# Patient Record
Sex: Male | Born: 1990 | Race: White | Hispanic: No | Marital: Single | Smoking: Current every day smoker
Health system: Southern US, Community
[De-identification: ages and names within clinical notes are randomized; demographics above are authoritative.]

## PROBLEM LIST (undated history)

## (undated) ENCOUNTER — Emergency Department (HOSPITAL_COMMUNITY): Admission: EM | Discharge status: Absent without leave | Payer: Self-pay

## (undated) HISTORY — PX: ABSCESS DRAINAGE: SHX1119

## (undated) HISTORY — PX: MANDIBLE SURGERY: SHX707

---

## 2015-05-14 ENCOUNTER — Encounter (HOSPITAL_COMMUNITY): Payer: Self-pay | Admitting: Emergency Medicine

## 2015-05-14 ENCOUNTER — Emergency Department (HOSPITAL_COMMUNITY)
Admission: EM | Admit: 2015-05-14 | Discharge: 2015-05-14 | Disposition: A | Discharge status: Home / Self Care | Payer: Self-pay | Attending: Emergency Medicine | Admitting: Emergency Medicine

## 2015-05-14 DIAGNOSIS — K047 Periapical abscess without sinus: Secondary | ICD-10-CM | POA: Insufficient documentation

## 2015-05-14 DIAGNOSIS — F17210 Nicotine dependence, cigarettes, uncomplicated: Secondary | ICD-10-CM | POA: Insufficient documentation

## 2015-05-14 MED ORDER — IBUPROFEN 800 MG PO TABS: 800.0000 mg | ORAL_TABLET | Freq: Three times a day (TID) | ORAL | Status: DC

## 2015-05-14 MED ORDER — PROMETHAZINE HCL 12.5 MG PO TABS
12.5000 mg | ORAL_TABLET | Freq: Once | ORAL | Status: AC
  Administered 2015-05-14: 12.5 mg via ORAL
  Filled 2015-05-14: qty 1

## 2015-05-14 MED ORDER — IBUPROFEN 800 MG PO TABS
800.0000 mg | ORAL_TABLET | Freq: Once | ORAL | Status: AC
  Administered 2015-05-14: 800 mg via ORAL
  Filled 2015-05-14: qty 1

## 2015-05-14 MED ORDER — AMOXICILLIN 500 MG PO CAPS: 500.0000 mg | ORAL_CAPSULE | Freq: Three times a day (TID) | ORAL | Status: DC

## 2015-05-14 MED ORDER — CEFTRIAXONE SODIUM 1 G IJ SOLR
1.0000 g | Freq: Once | INTRAMUSCULAR | Status: AC
  Administered 2015-05-14: 1 g via INTRAMUSCULAR
  Filled 2015-05-14: qty 10

## 2015-05-14 MED ORDER — LIDOCAINE HCL (PF) 1 % IJ SOLN
INTRAMUSCULAR | Status: AC
  Administered 2015-05-14: 19:00:00
  Filled 2015-05-14: qty 5

## 2015-05-14 MED ORDER — ACETAMINOPHEN 325 MG PO TABS
650.0000 mg | ORAL_TABLET | Freq: Once | ORAL | Status: AC
  Administered 2015-05-14: 650 mg via ORAL
  Filled 2015-05-14: qty 2

## 2015-05-14 NOTE — ED Provider Notes (Signed)
CSN: [id number]     Arrival date & time 05/14/15  1715 History   First MD Initiated Contact with Patient 05/14/15 1808     Chief Complaint  Patient presents with  . Abscess     (Consider location/radiation/quality/duration/timing/severity/associated sxs/prior Treatment) Patient is a 25 y.o. male presenting with tooth pain. The history is provided by the patient.  Dental Pain Location:  Upper Quality:  Throbbing Severity:  Moderate Onset quality:  Gradual Duration:  5 days Timing:  Intermittent Progression:  Worsening Chronicity:  Chronic Context: abscess, dental caries and poor dentition   Relieved by:  Nothing Worsened by:  Cold food/drink Associated symptoms: gum swelling and headaches   Associated symptoms: no difficulty swallowing and no fever   Risk factors: lack of dental care and smoking   Risk factors: no diabetes and no immunosuppression     History reviewed. No pertinent past medical history. Past Surgical History  Procedure Laterality Date  . Mandible surgery     History reviewed. No pertinent family history. Social History  Substance Use Topics  . Smoking status: Current Every Day Smoker -- 0.50 packs/day    Types: Cigarettes  . Smokeless tobacco: None  . Alcohol Use: No    Review of Systems  Constitutional: Negative for fever.  HENT: Positive for dental problem.   Neurological: Positive for headaches.  All other systems reviewed and are negative.     Allergies  Review of patient's allergies indicates no known allergies.  Home Medications   Prior to Admission medications   Not on File   BP 145/90 mmHg  Pulse 92  Temp(Src) 97.3 F (36.3 C) (Oral)  Resp 18  Ht 6' (1.829 m)  Wt 90.719 kg  BMI 27.12 kg/m2  SpO2 100% Physical Exam  Constitutional: He is oriented to person, place, and time. He appears well-developed and well-nourished.  Non-toxic appearance.  HENT:  Head: Normocephalic.  Right Ear: Tympanic membrane and external ear  normal.  Left Ear: Tympanic membrane and external ear normal.  There are multiple dental caries on the right and the left. There is swelling of the upper left gums. There is no drainage visualized at this time. The airway is patent. There is no trismus, and no swelling under the tongue.  Eyes: EOM and lids are normal. Pupils are equal, round, and reactive to light.  Neck: Normal range of motion. Neck supple. Carotid bruit is not present.  Cardiovascular: Normal rate, regular rhythm, normal heart sounds, intact distal pulses and normal pulses.   Pulmonary/Chest: Breath sounds normal. No respiratory distress.  Abdominal: Soft. Bowel sounds are normal. There is no tenderness. There is no guarding.  Musculoskeletal: Normal range of motion.  Lymphadenopathy:       Head (right side): No submandibular adenopathy present.       Head (left side): No submandibular adenopathy present.    He has no cervical adenopathy.  Neurological: He is alert and oriented to person, place, and time. He has normal strength. No cranial nerve deficit or sensory deficit.  Skin: Skin is warm and dry.  Psychiatric: He has a normal mood and affect. His speech is normal.  Nursing note and vitals reviewed.   ED Course  Procedures (including critical care time) Labs Review Labs Reviewed - No data to display  Imaging Review No results found. I have personally reviewed and evaluated these images and lab results as part of my medical decision-making.   EKG Interpretation None      MDM  Vital  signs were within normal limits. Pulse oximetry is 100% on room air. The patient is treated with intramuscular Rocephin and oral ibuprofen and Tylenol Here in the emergency department.  Prescription for Amoxil and ibuprofen 800 given to the patient. Patient strongly encouraged to see a dentist as soon as possible. Patient knowledge is understanding of the discharge instructions.   Final diagnoses:  Dental abscess    *I have  reviewed nursing notes, vital signs, and all appropriate lab and imaging results for this patient.[id number] PA-C 05/17/15 1931  Bethann Berkshire, MD 05/18/15 [phone]

## 2015-05-14 NOTE — Discharge Instructions (Signed)
It is important that you see a dentist as soon as possible. Use amoxil and 800mg  of ibuprofen three times daily. Increase fluids. Dental Abscess A dental abscess is pus in or around a tooth. HOME CARE  Take medicines only as told by your dentist.  If you were prescribed antibiotic medicine, finish all of it even if you start to feel better.  Rinse your mouth (gargle) often with salt water.  Do not drive or use heavy machinery, like a lawn mower, while taking pain medicine.  Do not apply heat to the outside of your mouth.  Keep all follow-up visits as told by your dentist. This is important. GET HELP IF:  Your pain is worse, and medicine does not help. GET HELP RIGHT AWAY IF:  You have a fever or chills.  Your symptoms suddenly get worse.  You have a very bad headache.  You have problems breathing or swallowing.  You have trouble opening your mouth.  You have puffiness (swelling) in your neck or around your eye.   This information is not intended to replace advice given to you by your health care provider. Make sure you discuss any questions you have with your health care provider.   Document Released: 06/24/2014 Document Reviewed: 06/24/2014 Elsevier Interactive Patient Education [employer]2016 [employer] Inc.

## 2015-05-14 NOTE — ED Notes (Signed)
Pt reports dental abscess to left upper teeth with drainage and foul taste since Saturday.  Pt denies fevers.  Pt has not been to a dentist.

## 2016-04-15 ENCOUNTER — Emergency Department (HOSPITAL_COMMUNITY)
Admission: EM | Admit: 2016-04-15 | Discharge: 2016-04-15 | Disposition: A | Discharge status: Home / Self Care | Payer: Self-pay | Attending: Emergency Medicine | Admitting: Emergency Medicine

## 2016-04-15 ENCOUNTER — Encounter (HOSPITAL_COMMUNITY): Payer: Self-pay | Admitting: Emergency Medicine

## 2016-04-15 DIAGNOSIS — L03114 Cellulitis of left upper limb: Secondary | ICD-10-CM | POA: Insufficient documentation

## 2016-04-15 DIAGNOSIS — F17210 Nicotine dependence, cigarettes, uncomplicated: Secondary | ICD-10-CM | POA: Insufficient documentation

## 2016-04-15 MED ORDER — SULFAMETHOXAZOLE-TRIMETHOPRIM 800-160 MG PO TABS
1.0000 | ORAL_TABLET | Freq: Once | ORAL | Status: DC
  Filled 2016-04-15: qty 1

## 2016-04-15 MED ORDER — SULFAMETHOXAZOLE-TRIMETHOPRIM 800-160 MG PO TABS: 1.0000 | ORAL_TABLET | Freq: Two times a day (BID) | ORAL | 0 refills | Status: AC

## 2016-04-15 NOTE — ED Triage Notes (Signed)
PT states hx of abscess to left AC of arm x1 year ago. PT states 4 days ago area because red/painful and blistering with no fever or drainage. PT states last use of drugs was opana injection last week in left arm.

## 2016-04-15 NOTE — Discharge Instructions (Signed)
As discussed, your evaluation today has been largely reassuring.  But, it is important that you monitor your condition carefully, and do not hesitate to return to the ED if you develop new, or concerning changes in your condition.  Please take all medication as prescribed, and use warm compresses on the affected area.  Otherwise, please follow-up in the clinic for appropriate ongoing care.

## 2016-04-15 NOTE — ED Notes (Addendum)
Pt left AMA without medication RX or discharge paperwork.  No IV given and NO vitals taken. No signature obtained.  Lesly Dukesachel J Everett, RN

## 2016-04-15 NOTE — ED Provider Notes (Signed)
[facility]EMERGENCY DEPT Provider Note   CSN: [id number] Arrival date & time: 04/15/16  1553     History   Chief Complaint Chief Complaint  Patient presents with  . Abscess    HPI Hector Boyd is a 26 y.o. male.  HPI  Young male presents with concern of new left arm lesion. Patient has a painful swollen area in the left arm that developed about 4 days ago after he injected hydromorphone into the area. He has a notable history of prior surgical repair of abscess in this region. Patient notes that he is battling substance abuse, but had been doing generally well until this week. Specifically he currently denies any other ongoing use of illicit substances. He also denies any new fever, chills, confusion, disorientation, nausea, vomiting, chest pain, dyspnea.   History reviewed. No pertinent past medical history.  There are no active problems to display for this patient.   Past Surgical History:  Procedure Laterality Date  . ABSCESS DRAINAGE    . MANDIBLE SURGERY    . MANDIBLE SURGERY         Home Medications    Prior to Admission medications   Medication Sig Start Date End Date Taking? Authorizing Provider  sulfamethoxazole-trimethoprim (BACTRIM DS,SEPTRA DS) 800-160 MG tablet Take 1 tablet by mouth 2 (two) times daily. 04/15/16 04/22/16  Gerhard Munchobert Jhalil Silvera, MD    Family History History reviewed. No pertinent family history.  Social History Social History  Substance Use Topics  . Smoking status: Current Every Day Smoker    Packs/day: 0.50    Types: Cigarettes  . Smokeless tobacco: Never Used  . Alcohol use No     Allergies   Patient has no known allergies.   Review of Systems Review of Systems  Constitutional:       Per HPI, otherwise negative  HENT:       Per HPI, otherwise negative  Respiratory:       Per HPI, otherwise negative  Cardiovascular:       Per HPI, otherwise negative  Gastrointestinal: Negative for vomiting.  Endocrine:       Negative  aside from HPI  Genitourinary:       Neg aside from HPI   Musculoskeletal:       Per HPI, otherwise negative  Skin: Positive for wound.  Allergic/Immunologic: Negative for immunocompromised state.  Neurological: Negative for syncope.  Psychiatric/Behavioral:       Substance abuse Hx     Physical Exam Updated Vital Signs BP 135/67 (BP Location: Right Arm)   Pulse 115   Temp 98.7 F (37.1 C) (Oral)   Resp 18   Ht 6' (1.829 m)   Wt 200 lb (90.7 kg)   SpO2 100%   BMI 27.12 kg/m   Physical Exam  Constitutional: He is oriented to person, place, and time. He appears well-developed. No distress.  HENT:  Head: Normocephalic and atraumatic.  Eyes: Conjunctivae and EOM are normal.  Cardiovascular: Normal rate and regular rhythm.   Pulmonary/Chest: Effort normal. No stridor. No respiratory distress.  Abdominal: He exhibits no distension.  Musculoskeletal: He exhibits no edema.  Neurological: He is alert and oriented to person, place, and time.  Distal to the lesion the patient is neurovascularly intact  Skin: Skin is warm and dry.  Mid left AC there is an area of induration, no fluctuance, erythema, approximately 3 cm in diameter, with subcutaneous induration surrounding it.  Psychiatric: He has a normal mood and affect.  Nursing note and vitals  reviewed.    ED Treatments / Results   Procedures Procedures (including critical care time)  Medications Ordered in ED Medications  sulfamethoxazole-trimethoprim (BACTRIM DS,SEPTRA DS) 800-160 MG per tablet 1 tablet (not administered)     Initial Impression / Assessment and Plan / ED Course  I have reviewed the triage vital signs and the nursing notes.  Pertinent labs & imaging results that were available during my care of the patient were reviewed by me and considered in my medical decision making (see chart for details).  Young male with history of IV drug abuse presents with left arm lesion There is evidence for cellulitis,  but no area amenable to drainage. No evidence for bacteremia, sepsis. No murmur suggesting endocarditis. Patient is distally neurovascularly intact. We discussed resources for treatment assistance, importance of taking a box, monitoring his wound, and the patient was discharged in stable condition.   Final Clinical Impressions(s) / ED Diagnoses   Final diagnoses:  Cellulitis of left upper extremity    New Prescriptions New Prescriptions   SULFAMETHOXAZOLE-TRIMETHOPRIM (BACTRIM DS,SEPTRA DS) 800-160 MG TABLET    Take 1 tablet by mouth 2 (two) times daily.     Gerhard Munch, MD 04/15/16 [phone]

## 2018-02-20 ENCOUNTER — Other Ambulatory Visit: Payer: Self-pay

## 2018-02-20 ENCOUNTER — Emergency Department (HOSPITAL_COMMUNITY)
Admission: EM | Admit: 2018-02-20 | Discharge: 2018-02-20 | Disposition: A | Discharge status: Home / Self Care | Payer: Self-pay | Attending: Emergency Medicine | Admitting: Emergency Medicine

## 2018-02-20 ENCOUNTER — Encounter (HOSPITAL_COMMUNITY): Payer: Self-pay | Admitting: *Deleted

## 2018-02-20 ENCOUNTER — Emergency Department (HOSPITAL_COMMUNITY): Payer: Self-pay

## 2018-02-20 DIAGNOSIS — Y999 Unspecified external cause status: Secondary | ICD-10-CM | POA: Insufficient documentation

## 2018-02-20 DIAGNOSIS — Y9389 Activity, other specified: Secondary | ICD-10-CM | POA: Insufficient documentation

## 2018-02-20 DIAGNOSIS — W228XXA Striking against or struck by other objects, initial encounter: Secondary | ICD-10-CM | POA: Insufficient documentation

## 2018-02-20 DIAGNOSIS — F17210 Nicotine dependence, cigarettes, uncomplicated: Secondary | ICD-10-CM | POA: Insufficient documentation

## 2018-02-20 DIAGNOSIS — Y929 Unspecified place or not applicable: Secondary | ICD-10-CM | POA: Insufficient documentation

## 2018-02-20 DIAGNOSIS — S61214A Laceration without foreign body of right ring finger without damage to nail, initial encounter: Secondary | ICD-10-CM | POA: Insufficient documentation

## 2018-02-20 MED ORDER — HYDROCODONE-ACETAMINOPHEN 5-325 MG PO TABS
1.0000 | ORAL_TABLET | Freq: Once | ORAL | Status: AC
  Administered 2018-02-20: 1 via ORAL
  Filled 2018-02-20: qty 1

## 2018-02-20 MED ORDER — CEPHALEXIN 500 MG PO CAPS: 500.0000 mg | ORAL_CAPSULE | Freq: Four times a day (QID) | ORAL | 0 refills | Status: AC

## 2018-02-20 MED ORDER — LIDOCAINE HCL (PF) 1 % IJ SOLN
5.0000 mL | Freq: Once | INTRAMUSCULAR | Status: AC
  Administered 2018-02-20: 5 mL
  Filled 2018-02-20: qty 6

## 2018-02-20 MED ORDER — BACITRACIN ZINC 500 UNIT/GM EX OINT
TOPICAL_OINTMENT | CUTANEOUS | Status: AC
  Administered 2018-02-20: 15:00:00
  Filled 2018-02-20: qty 0.9

## 2018-02-20 MED ORDER — POVIDONE-IODINE 10 % EX SOLN
CUTANEOUS | Status: AC
  Filled 2018-02-20: qty 30

## 2018-02-20 NOTE — Discharge Instructions (Signed)
Please read and follow all provided instructions.  Your diagnoses today is a laceration. A laceration is a cut or lesion that goes through all layers of the skin and into the tissue just beneath the skin. This was repaired with 6 stitches or a tissue adhesive similar to a super glue. You will need to follow up in 2 days for a wound recheck. As we discussed, given the duration of time from onset of the wound to closure, you are at increased risk for infection. I am starting you on antibiotics. Please take to completion. Follow up with your doctor, an urgent care, or this Emergency Department for removal of your stitches in 7-10 days. Keep the wound clean and dry for the next 24 hours and leave the dressing in place. You may shower after 24 hours. Do not soak the area for long periods of times as in a bath until the sutures are removed. After 24 hours you may remove the dressing and gently clean the laceration site with antibacterial soap (i.e. Neosporin or Bacitracin) and warm water 2 times a day. Pat dry with clean towel. Do not scrub. Once the wound has healed, scarring can be minimized by covering the wound with sunscreen during the day for 1 full year.  Return instructions:  You have redness, swelling, or increasing pain in the wound.  You see a red line that goes away from the wound.  You have yellowish-white fluid (pus) coming from the wound.  You have a fever (above 100.82F) You notice a bad smell coming from the wound or dressing.  Your wound breaks open before or after sutures have been removed.  You notice something coming out of the wound such as wood or glass.  Your wound is on your hand or foot and you cannot move a finger or toe.  Your pain is not controlled with prescribed medicine.     Additional Information:  If you did not receive a tetanus shot today because you thought you were up to date, but did not recall when your last one was given, make sure to check with your primary  caregiver to determine if you need one.   Your vital signs today were: BP 136/88    Pulse 90    Temp 98.7 F (37.1 C) (Oral)    Resp 20    Ht 6' (1.829 m)    Wt 90.7 kg    SpO2 100%    BMI 27.12 kg/m  If your blood pressure (BP) was elevated above 135/85 this visit, please have this repeated by your doctor within one month.

## 2018-02-20 NOTE — ED Provider Notes (Signed)
[facility] EMERGENCY DEPARTMENT Provider Note   CSN: [id number] Arrival date & time: 02/20/18  1113     History   Chief Complaint Chief Complaint  Patient presents with  . Hand Injury    HPI Hector HammondJeremy Boyd is a 27 y.o. right handed male with no significant past medical history presents emergency department today for hand laceration.  Patient reports around 9 PM last night, 02/19/2018, he was attempting to lift a 10 door when he caught his fourth digit on his right hand across the tin.  Patient did not clean the area.  He left it open and exposed.  He has not taken anything for pain.  His last tetanus shot was 1 year ago.  Patient is now complaining of pain to the area that he rates as moderate in severity, constant and worse with range of motion.  Patient denies any decreased range of motion however again notes this is painful.  Patient denies any fever, nausea, vomiting.  No paresthesias.  HPI  History reviewed. No pertinent past medical history.  There are no active problems to display for this patient.   Past Surgical History:  Procedure Laterality Date  . ABSCESS DRAINAGE    . MANDIBLE SURGERY    . MANDIBLE SURGERY          Home Medications    Prior to Admission medications   Not on File    Family History No family history on file.  Social History Social History   Tobacco Use  . Smoking status: Current Every Day Smoker    Packs/day: 1.00    Types: Cigarettes  . Smokeless tobacco: Never Used  Substance Use Topics  . Alcohol use: No  . Drug use: Not Currently    Types: IV     Allergies   Patient has no known allergies.   Review of Systems Review of Systems  Constitutional: Negative for fever.  Musculoskeletal: Positive for arthralgias.  Skin: Positive for wound.  Allergic/Immunologic: Negative for immunocompromised state.  Neurological: Negative for weakness and numbness.     Physical Exam Updated Vital Signs BP 136/88   Pulse 90   Temp  98.7 F (37.1 C) (Oral)   Resp 20   Ht 6' (1.829 m)   Wt 90.7 kg   SpO2 100%   BMI 27.12 kg/m   Physical Exam Vitals signs and nursing note reviewed.  Constitutional:      Appearance: He is well-developed.  HENT:     Head: Normocephalic and atraumatic.     Right Ear: External ear normal.     Left Ear: External ear normal.  Eyes:     General: No scleral icterus.       Right eye: No discharge.        Left eye: No discharge.     Conjunctiva/sclera: Conjunctivae normal.  Cardiovascular:     Pulses:          Radial pulses are 2+ on the right side.  Pulmonary:     Effort: Pulmonary effort is normal. No respiratory distress.  Musculoskeletal:     Right hand: He exhibits tenderness and laceration. He exhibits normal range of motion (able resisted flexion and extension for mcp, pip and dip of 4th digit) and normal capillary refill. Normal sensation noted. Normal strength noted.       Hands:  Skin:    General: Skin is warm and dry.     Capillary Refill: Capillary refill takes less than 2 seconds.  Coloration: Skin is not pale.     Findings: Laceration present.  Neurological:     Mental Status: He is alert.        ED Treatments / Results  Labs (all labs ordered are listed, but only abnormal results are displayed) Labs Reviewed - No data to display  EKG None  Radiology Dg Hand Complete Right  Result Date: 02/20/2018 CLINICAL DATA:  Right hand injury. EXAM: RIGHT HAND - COMPLETE 3+ VIEW COMPARISON:  No prior. FINDINGS: No acute bony or joint abnormality identified. No evidence of fracture or dislocation. IMPRESSION: No acute abnormality. Electronically Signed   By: Maisie Fushomas  Register   On: 02/20/2018 12:01    Procedures .Marland Kitchen.Laceration Repair Date/Time: 02/20/2018 2:44 PM Performed by: Jacinto HalimMaczis,  M, PA-C Authorized by: Jacinto HalimMaczis,  M, PA-C   Consent:    Consent obtained:  Verbal   Consent given by:  Patient   Risks discussed:  Infection, need for  additional repair, nerve damage, poor wound healing, poor cosmetic result, pain, retained foreign body, tendon damage and vascular damage   Alternatives discussed:  No treatment Anesthesia (see MAR for exact dosages):    Anesthesia method:  None Laceration details:    Location:  Finger   Finger location:  R ring finger   Length (cm):  4 Repair type:    Repair type:  Simple Pre-procedure details:    Preparation:  Patient was prepped and draped in usual sterile fashion and imaging obtained to evaluate for foreign bodies Exploration:    Hemostasis achieved with:  Direct pressure   Wound exploration: wound explored through full range of motion and entire depth of wound probed and visualized     Wound extent: no foreign bodies/material noted   Treatment:    Area cleansed with:  Betadine, Shur-Clens and saline   Amount of cleaning:  Extensive   Irrigation method:  Syringe   Visualized foreign bodies/material removed: no   Skin repair:    Repair method:  Sutures   Suture size:  5-0   Suture material:  Prolene   Suture technique:  Simple interrupted   Number of sutures:  6 Approximation:    Approximation:  Close Post-procedure details:    Dressing:  Antibiotic ointment and non-adherent dressing   Patient tolerance of procedure:  Tolerated well, no immediate complications   (including critical care time)  Medications Ordered in ED Medications  povidone-iodine (BETADINE) 10 % external solution (has no administration in time range)  lidocaine (PF) (XYLOCAINE) 1 % injection 5 mL (has no administration in time range)  HYDROcodone-acetaminophen (NORCO/VICODIN) 5-325 MG per tablet 1 tablet (1 tablet Oral Given 02/20/18 1330)     Initial Impression / Assessment and Plan / ED Course  I have reviewed the triage vital signs and the nursing notes.  Pertinent labs & imaging results that were available during my care of the patient were reviewed by me and considered in my medical decision  making (see chart for details).     27 y.o. male with laceration to his dominant hand on the dorsal aspect of his fourth digit.  Injury occurred approximately at 9 PM last night.  Bleeding is currently controlled.  He denies any paresthesias.  There is no evidence of tendon injury.  No evidence of infection at this time.  He is neurovascular intact.  X-rays obtained without evidence of fracture or foreign body.  Area was visualized in a bloodless field without any evidence of foreign body visualized.  Area was thoroughly cleansed  by nurse as well as myself.  Wound was repaired using sutures as it had occurred less than 18 hours prior to arrival.  Discussed with the patient he is close to the 18-hour mark and he has an increased risk for infection and is important to follow-up as there is a risk for infection.  Patient understands that there is a risk for infection and follow-up in 2 days for wound recheck.  I will start him on antibiotics.  His tetanus is up-to-date.  Return precautions discussed.  Final Clinical Impressions(s) / ED Diagnoses   Final diagnoses:  Laceration of right ring finger without foreign body without damage to nail, initial encounter    ED Discharge Orders         Ordered    cephALEXin (KEFLEX) 500 MG capsule  4 times daily     02/20/18 1447           Jacinto Halim, Cordelia Poche 02/20/18 1447    Vanetta Mulders, MD 02/21/18 1624

## 2018-02-20 NOTE — ED Triage Notes (Signed)
Right ring finger laceration onset last night. No treatment prior to arrival

## 2018-02-20 NOTE — ED Notes (Signed)
Wound cleaned with SafeClens and irrigated with of NS.

## 2018-09-18 ENCOUNTER — Other Ambulatory Visit: Payer: Self-pay

## 2018-09-18 ENCOUNTER — Encounter (HOSPITAL_COMMUNITY): Payer: Self-pay | Admitting: *Deleted

## 2018-09-18 ENCOUNTER — Emergency Department (HOSPITAL_COMMUNITY)
Admission: EM | Admit: 2018-09-18 | Discharge: 2018-09-18 | Disposition: A | Discharge status: Home / Self Care | Payer: Self-pay | Attending: Emergency Medicine | Admitting: Emergency Medicine

## 2018-09-18 DIAGNOSIS — K0889 Other specified disorders of teeth and supporting structures: Secondary | ICD-10-CM

## 2018-09-18 DIAGNOSIS — K047 Periapical abscess without sinus: Secondary | ICD-10-CM | POA: Insufficient documentation

## 2018-09-18 DIAGNOSIS — F17210 Nicotine dependence, cigarettes, uncomplicated: Secondary | ICD-10-CM | POA: Insufficient documentation

## 2018-09-18 MED ORDER — LIDOCAINE VISCOUS HCL 2 % MT SOLN: 15.0000 mL | OROMUCOSAL | 0 refills | Status: AC | PRN

## 2018-09-18 MED ORDER — NAPROXEN 500 MG PO TABS: 500.0000 mg | ORAL_TABLET | Freq: Two times a day (BID) | ORAL | 0 refills | Status: AC

## 2018-09-18 MED ORDER — HYDROCODONE-ACETAMINOPHEN 5-325 MG PO TABS
1.0000 | ORAL_TABLET | Freq: Once | ORAL | Status: AC
Start: 2018-09-18 — End: 2018-09-18
  Administered 2018-09-18: 1 via ORAL
  Filled 2018-09-18: qty 1

## 2018-09-18 MED ORDER — PENICILLIN V POTASSIUM 250 MG PO TABS
500.0000 mg | ORAL_TABLET | Freq: Once | ORAL | Status: AC
  Administered 2018-09-18: 500 mg via ORAL
  Filled 2018-09-18: qty 2

## 2018-09-18 MED ORDER — PENICILLIN V POTASSIUM 500 MG PO TABS: 500.0000 mg | ORAL_TABLET | Freq: Four times a day (QID) | ORAL | 0 refills | Status: AC

## 2018-09-18 MED ORDER — ACETAMINOPHEN 500 MG PO TABS: 500.0000 mg | ORAL_TABLET | Freq: Four times a day (QID) | ORAL | 0 refills | Status: AC | PRN

## 2018-09-18 NOTE — ED Provider Notes (Signed)
[facility] EMERGENCY DEPARTMENT Provider Note   CSN: [id number] Arrival date & time: 09/18/18  1149    History   Chief Complaint Chief Complaint  Patient presents with  . Dental Pain    HPI Hector Boyd is a 28 y.o. male presents for evaluation of acute onset, progressively worsening left mandibular dental pain for 5 days.  Reports symptoms began on Friday with pain, then progressed to facial swelling the day after.  Reports pain is constant and "jabbing", worsens with exposure to hot or cold foods or air.  He has been taking Goody's powder with some improvement.  Denies fever, difficulty breathing or swallowing, abdominal pain, nausea, vomiting.  He does not have a dentist.     The history is provided by the patient.    History reviewed. No pertinent past medical history.  There are no active problems to display for this patient.   Past Surgical History:  Procedure Laterality Date  . ABSCESS DRAINAGE    . MANDIBLE SURGERY    . MANDIBLE SURGERY          Home Medications    Prior to Admission medications   Medication Sig Start Date End Date Taking? Authorizing Provider  acetaminophen (TYLENOL) 500 MG tablet Take 1 tablet (500 mg total) by mouth every 6 (six) hours as needed. 09/18/18   Nils Flack, Shivank Pinedo A, PA-C  cephALEXin (KEFLEX) 500 MG capsule Take 1 capsule (500 mg total) by mouth 4 (four) times daily. 02/20/18   Maczis, Barth Kirks, PA-C  lidocaine (XYLOCAINE) 2 % solution Use as directed 15 mLs in the mouth or throat as needed for mouth pain. 09/18/18   Nils Flack, Asar Evilsizer A, PA-C  naproxen (NAPROSYN) 500 MG tablet Take 1 tablet (500 mg total) by mouth 2 (two) times daily with a meal. 09/18/18   Bard Haupert A, PA-C  penicillin v potassium (VEETID) 500 MG tablet Take 1 tablet (500 mg total) by mouth 4 (four) times daily for 7 days. 09/18/18 09/25/18  Renita Papa, PA-C    Family History No family history on file.  Social History Social History   Tobacco Use  . Smoking status:  Current Every Day Smoker    Packs/day: 1.00    Types: Cigarettes  . Smokeless tobacco: Never Used  Substance Use Topics  . Alcohol use: No  . Drug use: Not Currently    Types: IV     Allergies   Patient has no known allergies.   Review of Systems Review of Systems  Constitutional: Negative for chills and fever.  HENT: Positive for dental problem and facial swelling. Negative for drooling, sore throat and trouble swallowing.   Respiratory: Negative for shortness of breath.   Gastrointestinal: Negative for abdominal pain, nausea and vomiting.     Physical Exam Updated Vital Signs BP 139/82   Pulse 67   Temp 97.6 F (36.4 C)   Resp 18   Ht 5\' 10"  (1.778 m)   Wt 99.8 kg   SpO2 99%   BMI 31.57 kg/m   Physical Exam Vitals signs and nursing note reviewed.  Constitutional:      General: He is not in acute distress.    Appearance: He is well-developed.     Comments: Appears uncomfortable  HENT:     Head: Normocephalic and atraumatic.     Mouth/Throat:     Mouth: Mucous membranes are moist.     Pharynx: Uvula midline. No oropharyngeal exudate.     Tonsils: No tonsillar exudate or  tonsillar abscesses.     Comments: Patient with diffusely decaying dentition with cracked left mandibular molars with exposed dentin.  There is some surrounding gingival irritation but no drainage.  Dentition appears to be stable.No trismus. Mouth opening to at least 3 finger widths. Handles oral secretions without difficulty. He has mild facial swelling along the left mandible with some overlying tenderness but no significant submental or submandibular swelling.No swelling or tenderness into the soft tissues of the neck. No erythema or induration noted externally.   Eyes:     General:        Right eye: No discharge.        Left eye: No discharge.     Conjunctiva/sclera: Conjunctivae normal.  Neck:     Musculoskeletal: Normal range of motion and neck supple. No neck rigidity or muscular  tenderness.     Vascular: No JVD.     Trachea: No tracheal deviation.  Cardiovascular:     Rate and Rhythm: Normal rate.  Pulmonary:     Effort: Pulmonary effort is normal.  Abdominal:     General: There is no distension.  Skin:    General: Skin is warm and dry.     Findings: No erythema.  Neurological:     Mental Status: He is alert.  Psychiatric:        Behavior: Behavior normal.      ED Treatments / Results  Labs (all labs ordered are listed, but only abnormal results are displayed) Labs Reviewed - No data to display  EKG None  Radiology No results found.  Procedures Procedures (including critical care time)  Medications Ordered in ED Medications  penicillin v potassium (VEETID) tablet 500 mg (has no administration in time range)  HYDROcodone-acetaminophen (NORCO/VICODIN) 5-325 MG per tablet 1 tablet (has no administration in time range)     Initial Impression / Assessment and Plan / ED Course  I have reviewed the triage vital signs and the nursing notes.  Pertinent labs & imaging results that were available during my care of the patient were reviewed by me and considered in my medical decision making (see chart for details).       Patient with dental pain and associated facial swelling.  Has diffusely decayed dentition on exam.  Patient is afebrile, vital signs are stable.  He is nontoxic in appearance.  Tolerating secretions without difficulty.  No gross abscess amenable to drainage on exam today.  Exam unconcerning for Ludwig's angina or spread of infection.  No evidence of peritonsillar abscess, meningitis, or strep pharyngitis. Full active range of motion of the neck without restriction or significant pain.  Will treat with penicillin, NSAIDs, Tylenol, viscous lidocaine swish and spit.  Urged patient to follow-up with dentist and provided with appropriate outpatient follow-up.  Discussed strict ED return precautions. Patient verbalized understanding of and  agreement with plan and is safe for discharge home at this time.    Final Clinical Impressions(s) / ED Diagnoses   Final diagnoses:  Dental abscess  Pain, dental    ED Discharge Orders         Ordered    naproxen (NAPROSYN) 500 MG tablet  2 times daily with meals     09/18/18 1218    penicillin v potassium (VEETID) 500 MG tablet  4 times daily     09/18/18 1218    lidocaine (XYLOCAINE) 2 % solution  As needed     09/18/18 1218    acetaminophen (TYLENOL) 500 MG tablet  Every  6 hours PRN     09/18/18 1221           Bennye AlmFawze, Captola Teschner A, PA-C 09/18/18 1224    Derwood KaplanNanavati, Ankit, MD 09/19/18 1105

## 2018-09-18 NOTE — ED Triage Notes (Signed)
Pt c/o dental pain to left lower side x couple of days. Pt has swelling to left jaw. Denies fever.

## 2018-09-18 NOTE — Discharge Instructions (Addendum)
Please take all of your antibiotics until finished!   You may develop abdominal discomfort or diarrhea from the antibiotic.  You may help offset this with probiotics which you can buy or get in yogurt. Do not eat  or take the probiotics until 2 hours after your antibiotic.   Apply warm compresses to jaw throughout the day.  Take naproxen twice daily with food for pain.  Do not take ibuprofen, Advil, Aleve, Motrin, Goody's powder while you are taking this medication.  In between doses of naproxen you can take [phone] mg of Tylenol every 6 hours as needed for pain. Do not exceed 4000 mg of Tylenol daily.  You may also use warm water salt gargles, Orajel, or other over-the-counter dental pain remedies.  Use lidocaine swish and spit as needed for dental pain but do not swallow this.   Followup with a dentist is very important for ongoing evaluation and management of recurrent dental pain.  I have given you the information for the dentist on call today.  Call his office and tell him you were seen in the emergency department.  I have also attached resources for follow-up in the area in this paperwork.   Return to emergency department for emergent changing or worsening symptoms such as fever, worsening facial swelling, difficulty breathing or swallowing, throat tightness, or vision changes.

## 2020-08-14 IMAGING — DX DG HAND COMPLETE 3+V*R*
3 series · 3 of 3 positions shown · non-contrast
Comparison: No prior.

CLINICAL DATA: Right hand injury.

EXAM:
RIGHT HAND - COMPLETE 3+ VIEW

[hand pa]
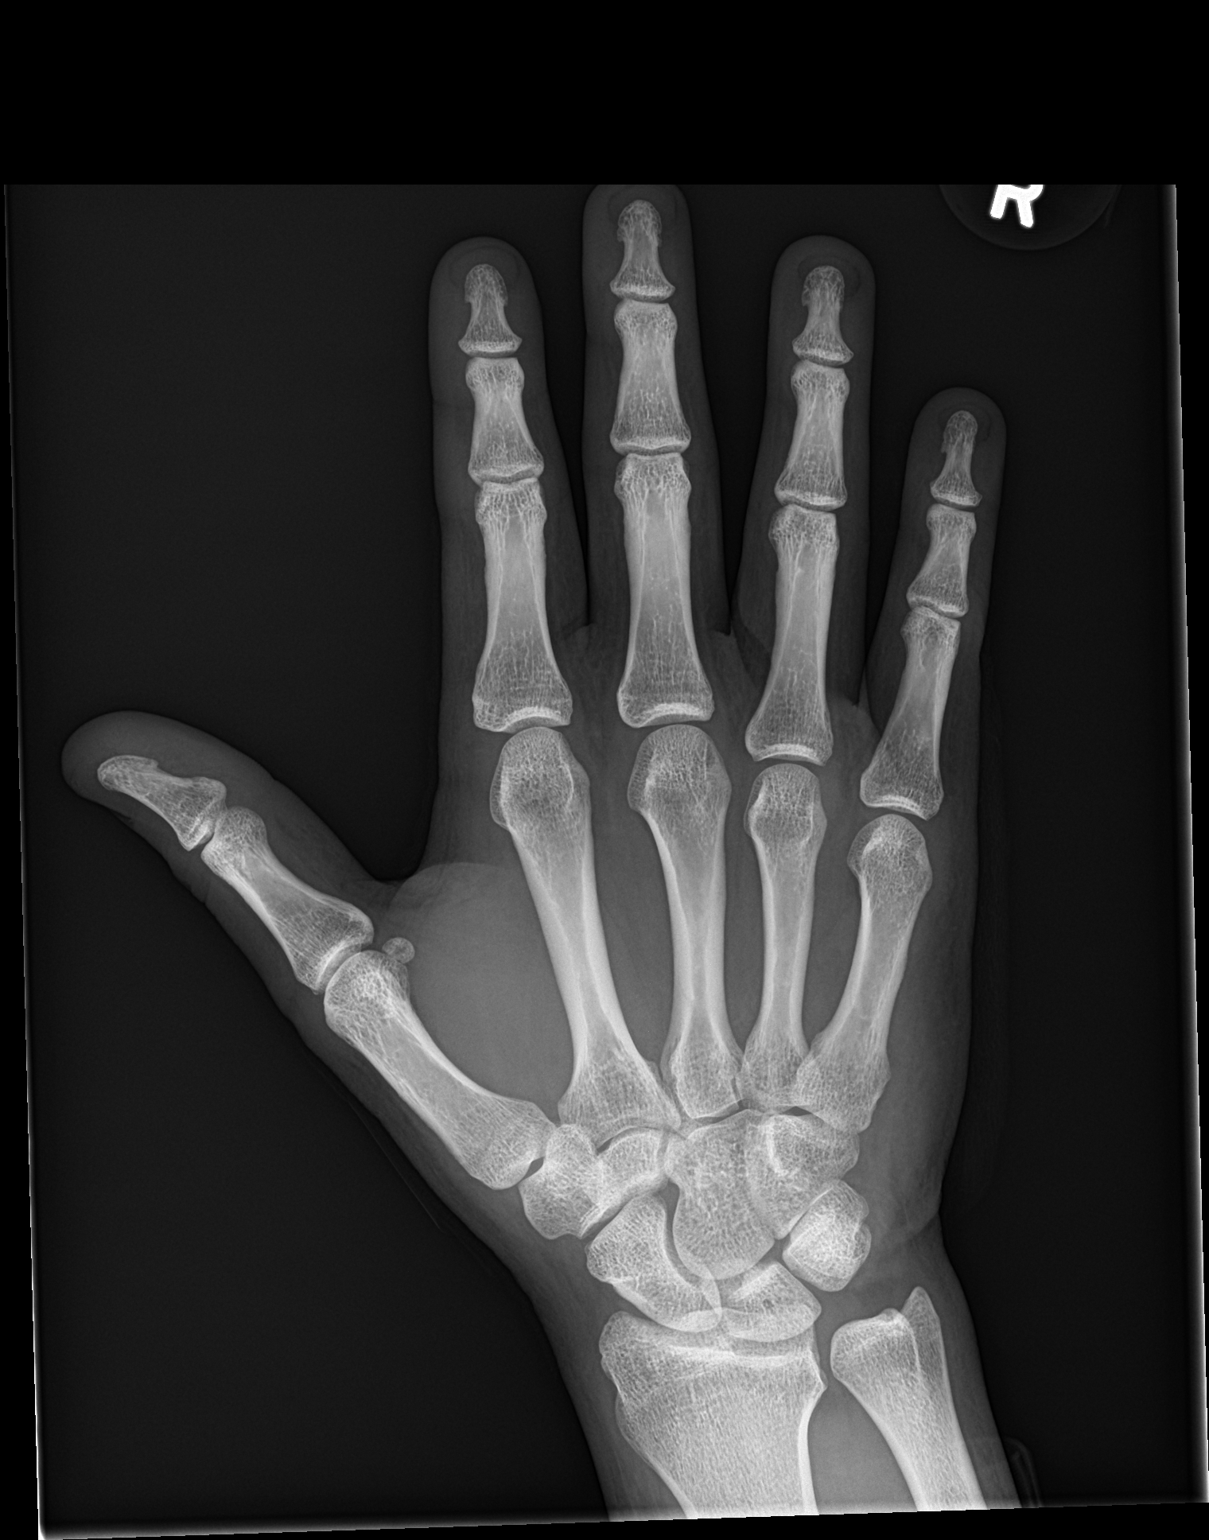

[hand obl]
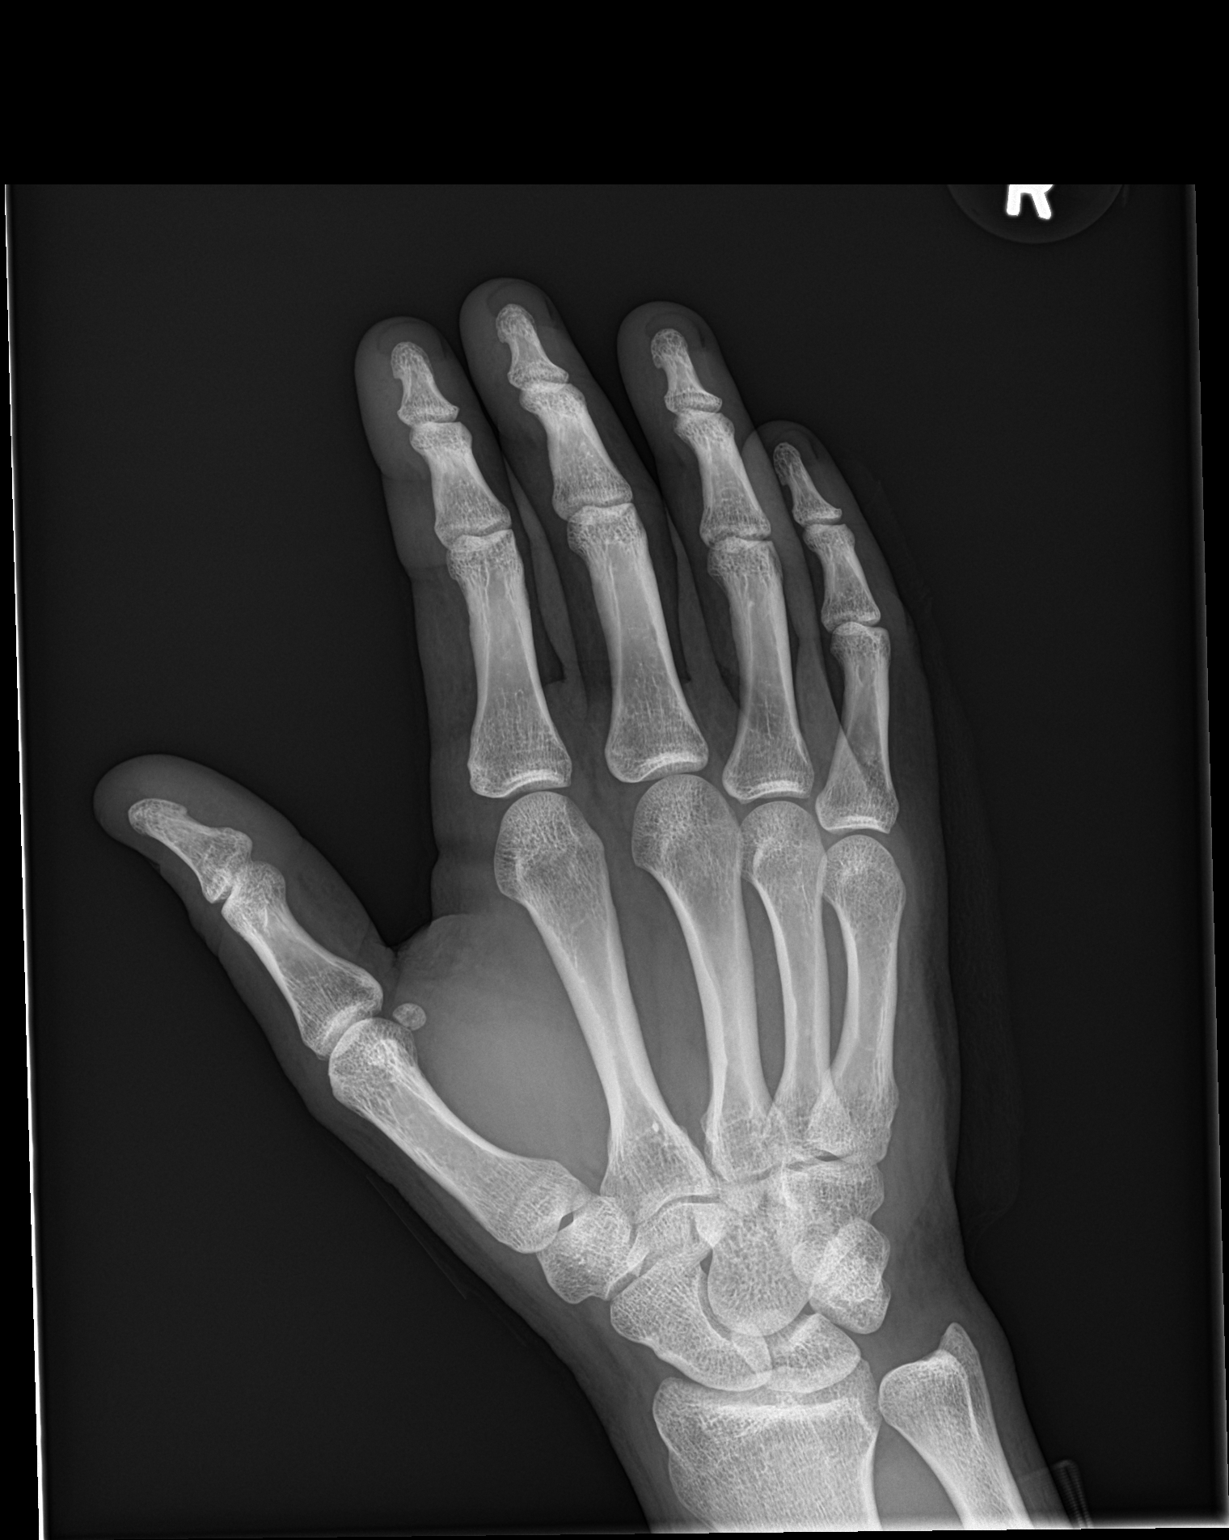

[hand lat]
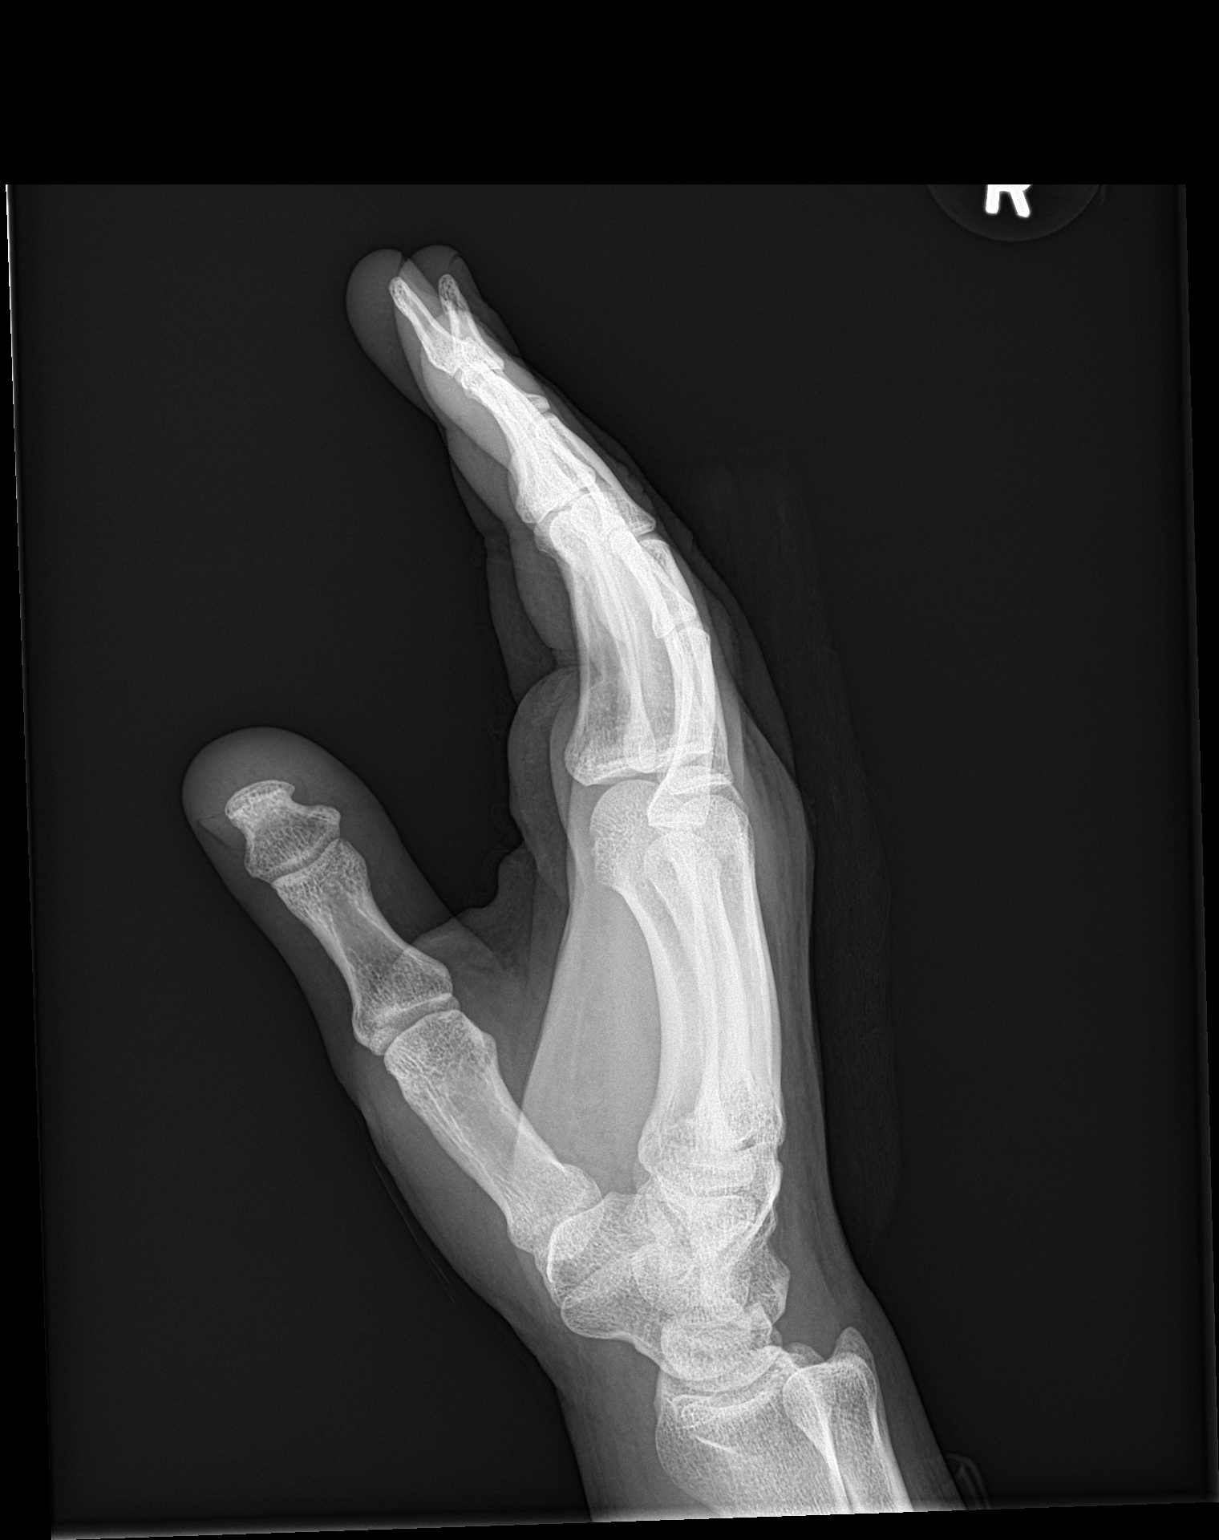

[3 of 3 positions shown; findings below may reference images not displayed]

FINDINGS: No acute bony or joint abnormality identified. No evidence of
fracture or dislocation.
IMPRESSION: No acute abnormality.
